# Patient Record
Sex: Male | Born: 1985 | Race: Black or African American | Hispanic: No | Marital: Single | Smoking: Never smoker
Health system: Southern US, Community
[De-identification: ages and names within clinical notes are randomized; demographics above are authoritative.]

---

## 2006-02-09 ENCOUNTER — Emergency Department: Payer: Self-pay

## 2008-10-18 ENCOUNTER — Ambulatory Visit: Payer: Self-pay | Admitting: Internal Medicine

## 2018-09-22 ENCOUNTER — Other Ambulatory Visit: Payer: Self-pay

## 2018-09-22 DIAGNOSIS — Z20822 Contact with and (suspected) exposure to covid-19: Secondary | ICD-10-CM

## 2018-09-23 LAB — NOVEL CORONAVIRUS, NAA: SARS-CoV-2, NAA: NOT DETECTED

## 2018-10-05 ENCOUNTER — Emergency Department
Admission: EM | Admit: 2018-10-05 | Discharge: 2018-10-05 | Disposition: A | Discharge status: Home / Self Care | Payer: BC Managed Care – PPO | Attending: Emergency Medicine | Admitting: Emergency Medicine

## 2018-10-05 ENCOUNTER — Encounter: Payer: Self-pay | Admitting: Emergency Medicine

## 2018-10-05 ENCOUNTER — Other Ambulatory Visit: Payer: Self-pay

## 2018-10-05 ENCOUNTER — Emergency Department: Payer: BC Managed Care – PPO

## 2018-10-05 DIAGNOSIS — Y999 Unspecified external cause status: Secondary | ICD-10-CM | POA: Diagnosis not present

## 2018-10-05 DIAGNOSIS — X500XXA Overexertion from strenuous movement or load, initial encounter: Secondary | ICD-10-CM | POA: Insufficient documentation

## 2018-10-05 DIAGNOSIS — Y9389 Activity, other specified: Secondary | ICD-10-CM | POA: Diagnosis not present

## 2018-10-05 DIAGNOSIS — S161XXA Strain of muscle, fascia and tendon at neck level, initial encounter: Secondary | ICD-10-CM | POA: Diagnosis not present

## 2018-10-05 DIAGNOSIS — Z79899 Other long term (current) drug therapy: Secondary | ICD-10-CM | POA: Insufficient documentation

## 2018-10-05 DIAGNOSIS — S299XXA Unspecified injury of thorax, initial encounter: Secondary | ICD-10-CM | POA: Diagnosis present

## 2018-10-05 DIAGNOSIS — R52 Pain, unspecified: Secondary | ICD-10-CM

## 2018-10-05 DIAGNOSIS — Y9289 Other specified places as the place of occurrence of the external cause: Secondary | ICD-10-CM | POA: Diagnosis not present

## 2018-10-05 MED ORDER — CYCLOBENZAPRINE HCL 10 MG PO TABS: 10.0000 mg | ORAL_TABLET | Freq: Three times a day (TID) | ORAL | 0 refills | Status: DC | PRN

## 2018-10-05 MED ORDER — IBUPROFEN 600 MG PO TABS: 600.0000 mg | ORAL_TABLET | Freq: Three times a day (TID) | ORAL | 0 refills | Status: DC | PRN

## 2018-10-05 NOTE — ED Notes (Signed)
Pt alert and oriented X4, cooperative, RR even and unlabored, color WNL. Pt in NAD.  

## 2018-10-05 NOTE — ED Triage Notes (Signed)
Pt reports last week felt like he pulled a muscle in his right arm/neck and sine then the pain has gotten worse. Pt reports hurts to look left or right, up or down.

## 2018-10-05 NOTE — Discharge Instructions (Addendum)
Follow discharge care instruction take medications as directed. °

## 2018-10-05 NOTE — ED Provider Notes (Signed)
[facility] Emergency Department Provider Note   ____________________________________________   First MD Initiated Contact with Patient 10/05/18 1258     (approximate)  I have reviewed the triage vital signs and the nursing notes.   HISTORY  Chief Complaint Back Pain    HPI Pau Banh is a 33 y.o. male patient complain of right lateral neck and upper back pain for 4 days.  Patient stated no specific provocative incident.  Patient rates he does repetitive lifting overhead at work.  Patient denies radicular component to his neck pain.  Patient denies loss sensation.  Patient rates his pain as a 7/10.  Patient described the pain as "achy".  Patient said no relief with over-the-counter anti-inflammatory medications.         History reviewed. No pertinent past medical history.  There are no active problems to display for this patient.   History reviewed. No pertinent surgical history.  Prior to Admission medications   Medication Sig Start Date End Date Taking? Authorizing Provider  cyclobenzaprine (FLEXERIL) 10 MG tablet Take 1 tablet (10 mg total) by mouth 3 (three) times daily as needed. 10/05/18   Sable Feil, PA-C  ibuprofen (ADVIL) 600 MG tablet Take 1 tablet (600 mg total) by mouth every 8 (eight) hours as needed. 10/05/18   Sable Feil, PA-C    Allergies Patient has no allergy information on record.  No family history on file.  Social History Social History   Tobacco Use  . Smoking status: Not on file  Substance Use Topics  . Alcohol use: Not on file  . Drug use: Not on file    Review of Systems Constitutional: No fever/chills Eyes: No visual changes. ENT: No sore throat. Cardiovascular: Denies chest pain. Respiratory: Denies shortness of breath. Gastrointestinal: No abdominal pain.  No nausea, no vomiting.  No diarrhea.  No constipation. Genitourinary: Negative for dysuria. Musculoskeletal: Right lateral neck and upper  back pain.  Skin: Negative for rash. Neurological: Negative for headaches, focal weakness or numbness.   ____________________________________________   PHYSICAL EXAM:  VITAL SIGNS: ED Triage Vitals  Enc Vitals Group     BP 10/05/18 1254 (!) 141/90     Pulse Rate 10/05/18 1254 85     Resp 10/05/18 1254 20     Temp 10/05/18 1254 98.7 F (37.1 C)     Temp Source 10/05/18 1254 Oral     SpO2 10/05/18 1254 97 %     Weight 10/05/18 1247 215 lb (97.5 kg)     Height 10/05/18 1247 5\' 11"  (1.803 m)     Head Circumference --      Peak Flow --      Pain Score 10/05/18 1247 7     Pain Loc --      Pain Edu? --      Excl. in [place]? --    Constitutional: Alert and oriented. Well appearing and in no acute distress. Neck: No stridor.  No cervical spine tenderness to palpation.  Decreased range of motion with lateral and flexion motions. Cardiovascular: Normal rate, regular rhythm. Grossly normal heart sounds.  Good peripheral circulation. Respiratory: Normal respiratory effort.  No retractions. Lungs CTAB. Gastrointestinal: Soft and nontender. No distention. No abdominal bruits. No CVA tenderness. Musculoskeletal: No lower extremity tenderness nor edema.  No joint effusions. Neurologic:  Normal speech and language. No gross focal neurologic deficits are appreciated. No gait instability. Skin:  Skin is warm, dry and intact. No rash noted. Psychiatric: Mood and  affect are normal. Speech and behavior are normal.  ____________________________________________   LABS (all labs ordered are listed, but only abnormal results are displayed)  Labs Reviewed - No data to display ____________________________________________  EKG   ____________________________________________  RADIOLOGY  ED MD interpretation:   Official radiology report(s): Dg Cervical Spine 2 Or 3 Views  Result Date: 10/05/2018 CLINICAL DATA:  Pt reports last week felt like he pulled a muscle in his right arm/neck and sine then  the pain has gotten worse. Pt reports hurts to look left or right, up or down. No previous surgery. Pt also denies any known injury EXAM: CERVICAL SPINE - 2-3 VIEW COMPARISON:  None. FINDINGS: There is no evidence of cervical spine fracture or prevertebral soft tissue swelling. Alignment is normal. No other significant bone abnormalities are identified. IMPRESSION: Negative cervical spine radiographs. Electronically Signed   By: Norva PavlovElizabeth  Brown M.D.   On: 10/05/2018 14:25    ____________________________________________   PROCEDURES  Procedure(s) performed (including Critical Care):  Procedures   ____________________________________________   INITIAL IMPRESSION / ASSESSMENT AND PLAN / ED COURSE  As part of my medical decision making, I reviewed the following data within the electronic MEDICAL RECORD NUMBER         Patient presents with nontraumatic neck and upper back pain for 1 week.  Patient states neck pain increased with lateral flexion motions.  Discussed neck x-ray findings with patient.  Discussed sequela of cervical strain with patient.  Patient given discharge care instruction advised take medication as directed.  Patient advised follow-up PCP if no improvement in 3 to 5 days.      ____________________________________________   FINAL CLINICAL IMPRESSION(S) / ED DIAGNOSES  Final diagnoses:  Cervical strain, acute, initial encounter     ED Discharge Orders         Ordered    cyclobenzaprine (FLEXERIL) 10 MG tablet  3 times daily PRN     10/05/18 1433    ibuprofen (ADVIL) 600 MG tablet  Every 8 hours PRN     10/05/18 1433           Note:  This document was prepared using Dragon voice recognition software and may include unintentional dictation errors.    Joni ReiningSmith, Consetta Cosner K, PA-C 10/05/18 1438    Emily FilbertWilliams, Jonathan E, MD 10/05/18 [phone]

## 2021-02-12 IMAGING — CR DG CERVICAL SPINE 2 OR 3 VIEWS
3 series · 3 of 3 positions shown · non-contrast
Comparison: None.

CLINICAL DATA: Pt reports last week felt like he pulled a muscle in
his right arm/neck and sine then the pain has gotten worse. Pt
reports hurts to look left or right, up or down. No previous
surgery. Pt also denies any known injury

EXAM:
CERVICAL SPINE - 2-3 VIEW

[c-spine lat]
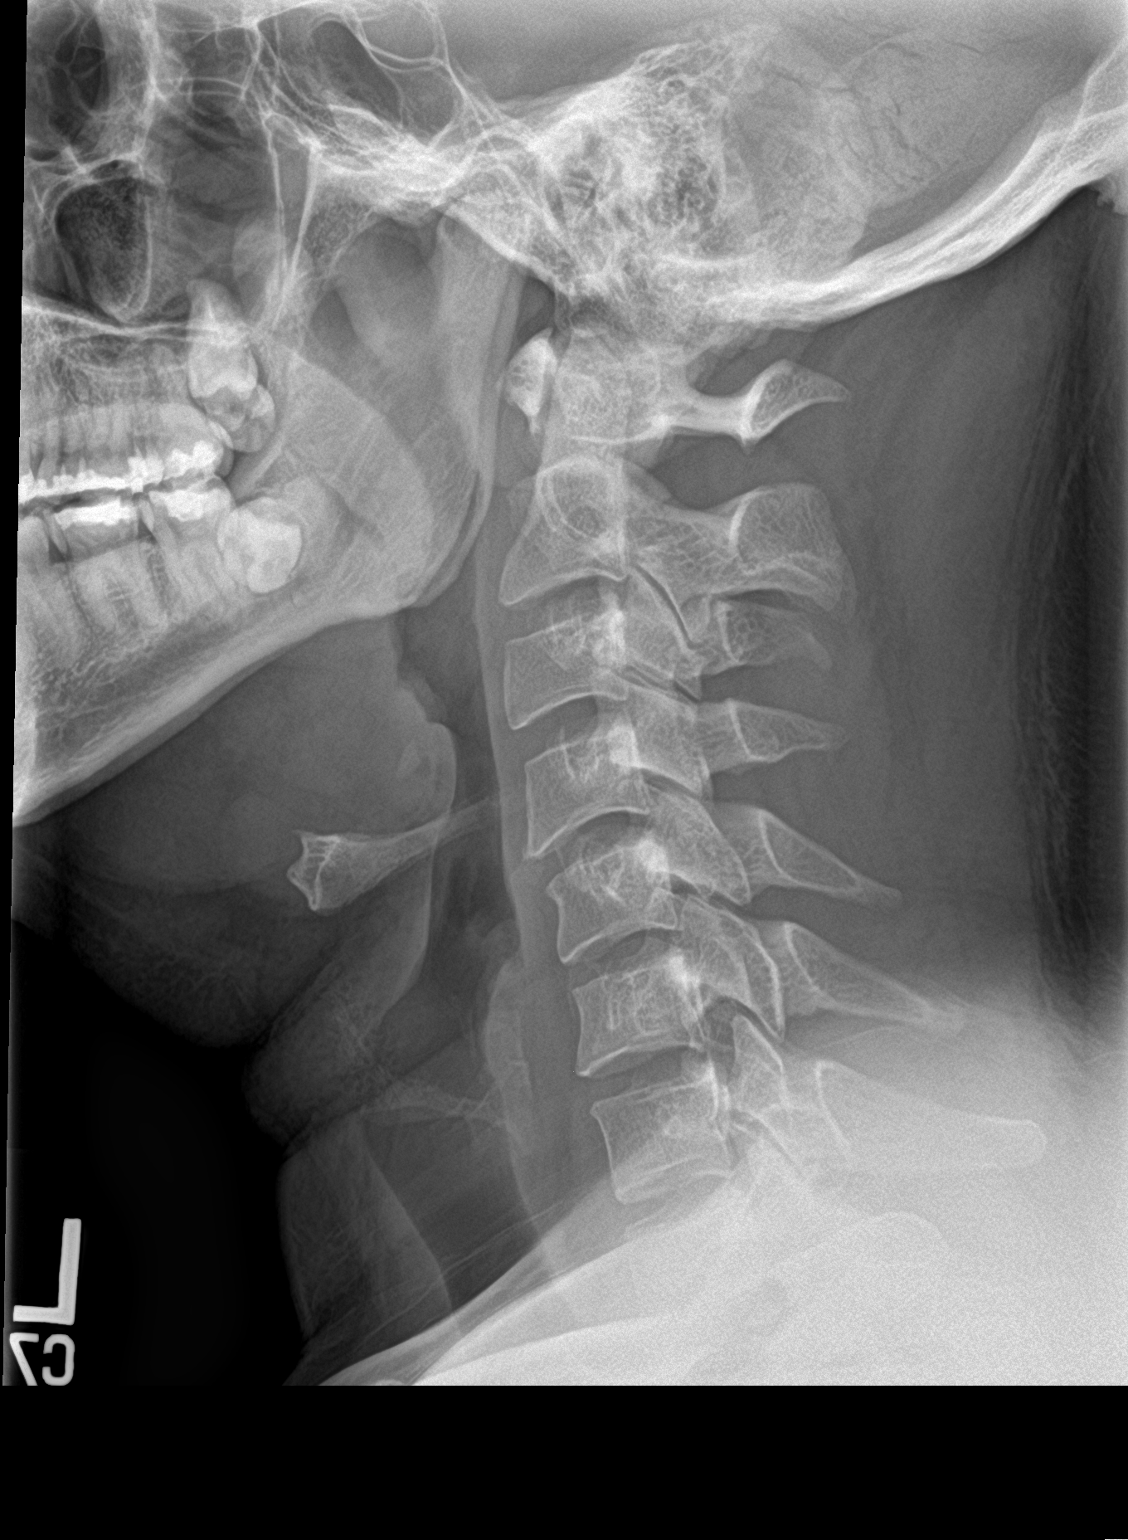

[c-spine ap]
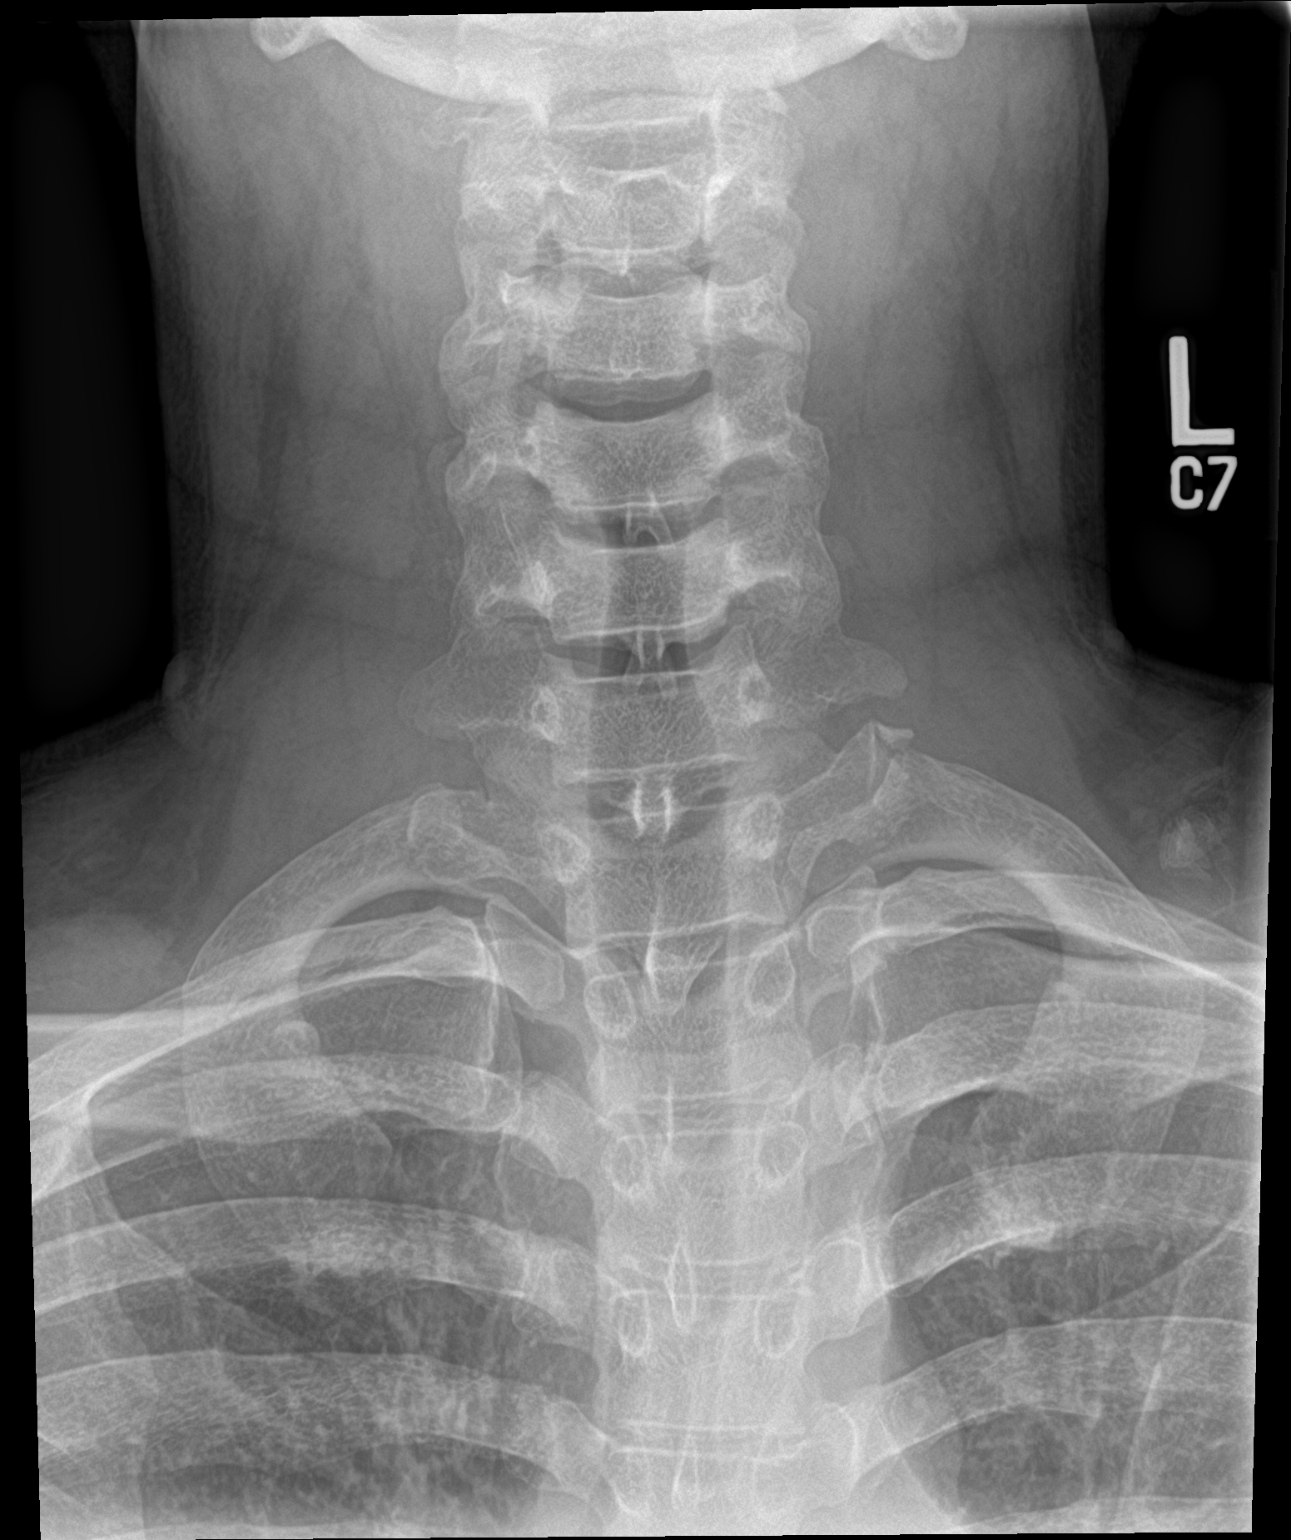

[c-spine open mouth]
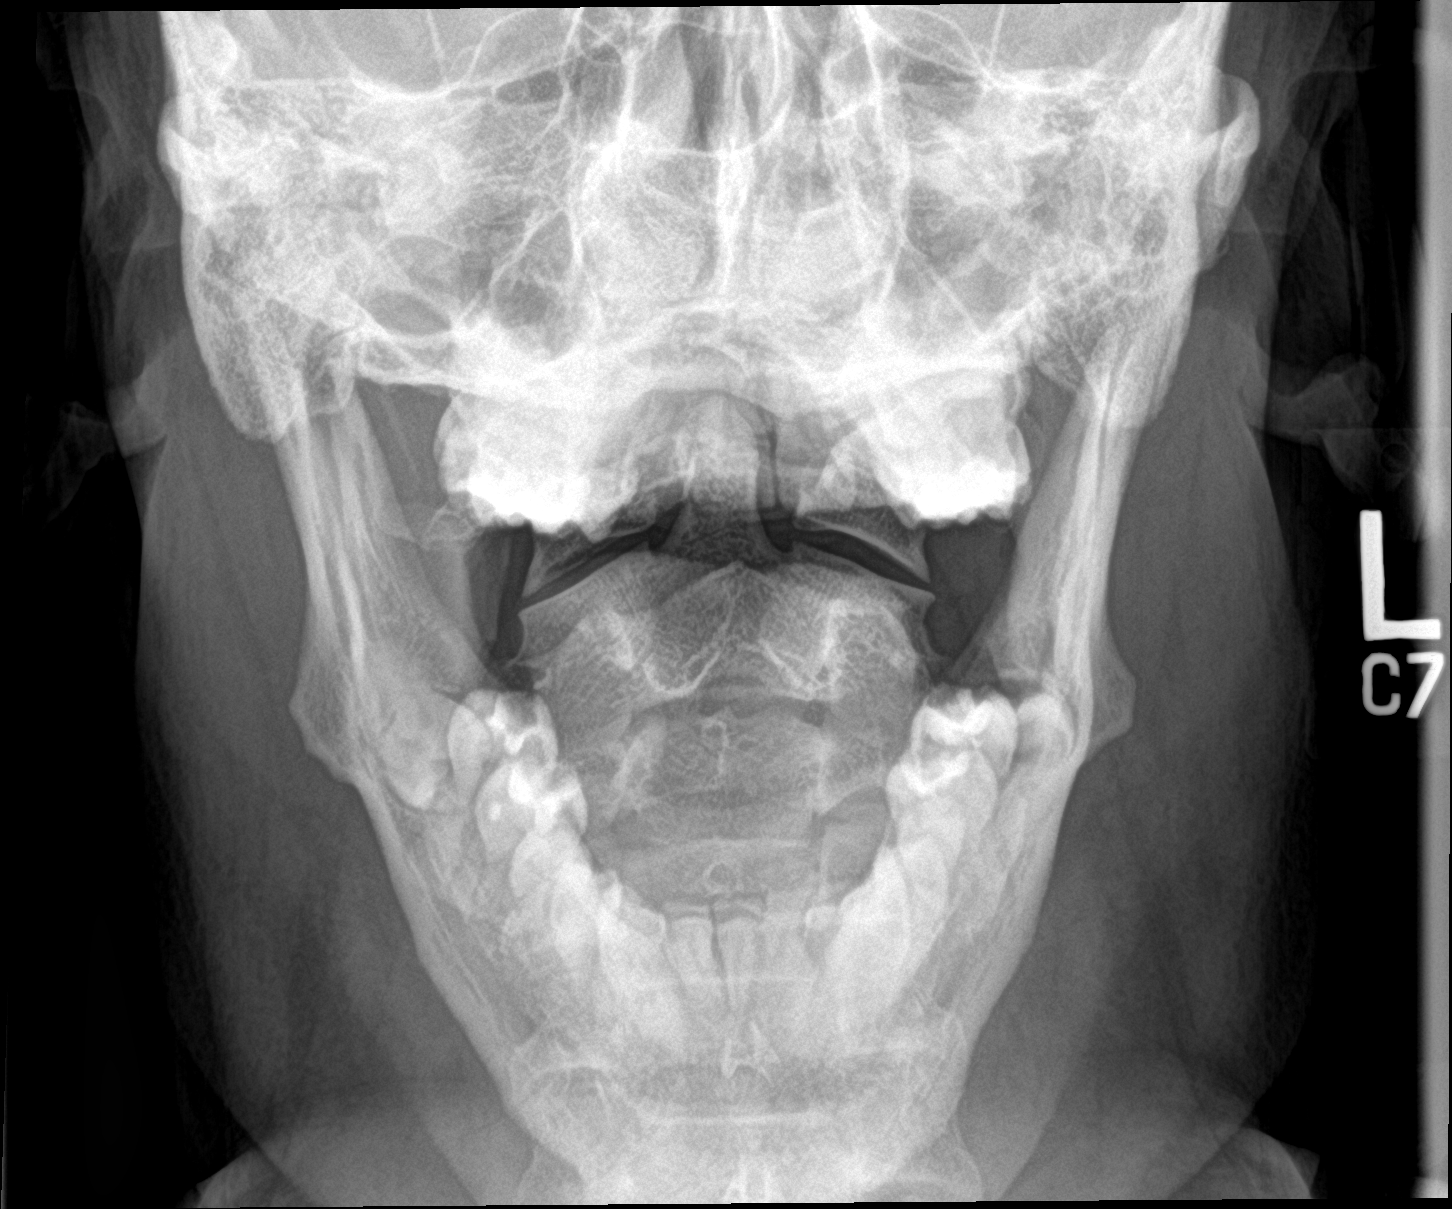

[3 of 3 positions shown; findings below may reference images not displayed]

FINDINGS: There is no evidence of cervical spine fracture or prevertebral soft
tissue swelling. Alignment is normal. No other significant bone
abnormalities are identified.
IMPRESSION: Negative cervical spine radiographs.

## 2023-06-06 ENCOUNTER — Encounter: Payer: Self-pay | Admitting: Intensive Care

## 2023-06-06 ENCOUNTER — Other Ambulatory Visit: Payer: Self-pay

## 2023-06-06 ENCOUNTER — Emergency Department
Admission: EM | Admit: 2023-06-06 | Discharge: 2023-06-06 | Disposition: A | Discharge status: Home / Self Care | Payer: PRIVATE HEALTH INSURANCE | Attending: Emergency Medicine | Admitting: Emergency Medicine

## 2023-06-06 DIAGNOSIS — X58XXXA Exposure to other specified factors, initial encounter: Secondary | ICD-10-CM | POA: Insufficient documentation

## 2023-06-06 DIAGNOSIS — S39012A Strain of muscle, fascia and tendon of lower back, initial encounter: Secondary | ICD-10-CM | POA: Diagnosis not present

## 2023-06-06 DIAGNOSIS — S3992XA Unspecified injury of lower back, initial encounter: Secondary | ICD-10-CM | POA: Diagnosis present

## 2023-06-06 MED ORDER — METHOCARBAMOL 500 MG PO TABS: 500.0000 mg | ORAL_TABLET | Freq: Three times a day (TID) | ORAL | 0 refills | Status: AC | PRN

## 2023-06-06 MED ORDER — LIDOCAINE 5 % EX PTCH
1.0000 | MEDICATED_PATCH | CUTANEOUS | Status: DC
  Administered 2023-06-06: 1 via TRANSDERMAL
  Filled 2023-06-06: qty 1

## 2023-06-06 MED ORDER — METHOCARBAMOL 500 MG PO TABS
500.0000 mg | ORAL_TABLET | Freq: Once | ORAL | Status: AC
  Administered 2023-06-06: 500 mg via ORAL
  Filled 2023-06-06: qty 1

## 2023-06-06 MED ORDER — LIDOCAINE 5 % EX PTCH: 1.0000 | MEDICATED_PATCH | Freq: Two times a day (BID) | CUTANEOUS | 1 refills | Status: AC

## 2023-06-06 MED ORDER — ACETAMINOPHEN 500 MG PO TABS
1000.0000 mg | ORAL_TABLET | Freq: Once | ORAL | Status: AC
  Administered 2023-06-06: 1000 mg via ORAL
  Filled 2023-06-06: qty 2

## 2023-06-06 MED ORDER — KETOROLAC TROMETHAMINE 30 MG/ML IJ SOLN
30.0000 mg | Freq: Once | INTRAMUSCULAR | Status: AC
  Administered 2023-06-06: 30 mg via INTRAMUSCULAR
  Filled 2023-06-06: qty 1

## 2023-06-06 NOTE — ED Provider Notes (Signed)
 [facility] Provider Note    Event Date/Time   First MD Initiated Contact with Patient 06/06/23 1127     (approximate)   History   Back Pain   HPI  Johnny West is a 38 y.o. male who presents to the ED for evaluation of Back Pain   Patient presents for evaluation of acute on chronic low back pain.  No discrete trauma, injuries.  No fevers, saddle anesthesias, urinary or stool changes.  No meds yet today.   Physical Exam   Triage Vital Signs: ED Triage Vitals  Encounter Vitals Group     BP 06/06/23 1123 (!) 139/91     Systolic BP Percentile --      Diastolic BP Percentile --      Pulse Rate 06/06/23 1123 78     Resp 06/06/23 1123 16     Temp 06/06/23 1123 98.7 F (37.1 C)     Temp Source 06/06/23 1123 Oral     SpO2 06/06/23 1123 97 %     Weight --      Height 06/06/23 1124 5\' 11"  (1.803 m)     Head Circumference --      Peak Flow --      Pain Score 06/06/23 1124 6     Pain Loc --      Pain Education --      Exclude from Growth Chart --     Most recent vital signs: Vitals:   06/06/23 1123  BP: (!) 139/91  Pulse: 78  Resp: 16  Temp: 98.7 F (37.1 C)  SpO2: 97%    General: Awake, no distress.  CV:  Good peripheral perfusion.  Resp:  Normal effort.  Abd:  No distention.  MSK:  No deformity noted.  Right-sided paraspinal lumbar tenderness is mild without overlying skin changes, rash or signs of trauma.  No midline spinal tenderness. Neuro:  No focal deficits appreciated. Cranial nerves II through XII intact 5/5 strength and sensation in all 4 extremities Other:     ED Results / Procedures / Treatments   Labs (all labs ordered are listed, but only abnormal results are displayed) Labs Reviewed - No data to display  EKG   RADIOLOGY   Official radiology report(s): No results found.  PROCEDURES and INTERVENTIONS:  Procedures  Medications  lidocaine (LIDODERM) 5 % 1 patch (1 patch Transdermal Patch Applied 06/06/23  1159)  acetaminophen (TYLENOL) tablet 1,000 mg (1,000 mg Oral Given 06/06/23 1157)  ketorolac (TORADOL) 30 MG/ML injection 30 mg (30 mg Intramuscular Given 06/06/23 1158)  methocarbamol (ROBAXIN) tablet 500 mg (500 mg Oral Given 06/06/23 1158)     IMPRESSION / MDM / ASSESSMENT AND PLAN / ED COURSE  I reviewed the triage vital signs and the nursing notes.  Differential diagnosis includes, but is not limited to, lower back strain, cauda equina, sciatica, fracture  {Patient presents with symptoms of an acute illness or injury that is potentially life-threatening.  Patient presents with acute on chronic atraumatic low back pain without red flag features.  No indications for plain film imaging to assess for fracture, no indication for MRI to assess for cauda equina.  Will provide nonnarcotic multimodal analgesia and reassess.  Suspect he will be suitable for discharge at this time.  Clinical Course as of 06/06/23 1230  Sun Jun 06, 2023  1223 Reassessed.  Feeling better.  We discussed pain medications at home, rehab exercises and appropriate ED return precautions. [DS]    Clinical Course  User Index [DS] Arline Bennett, MD     FINAL CLINICAL IMPRESSION(S) / ED DIAGNOSES   Final diagnoses:  Strain of lumbar region, initial encounter     Rx / DC Orders   ED Discharge Orders          Ordered    methocarbamol (ROBAXIN) 500 MG tablet  Every 8 hours PRN        06/06/23 1147    lidocaine (LIDODERM) 5 %  Every 12 hours        06/06/23 1147             Note:  This document was prepared using Dragon voice recognition software and may include unintentional dictation errors.   Arline Bennett, MD 06/06/23 1230

## 2023-06-06 NOTE — Discharge Instructions (Addendum)
Please take Tylenol and ibuprofen/Advil for your pain.  It is safe to take them together, or to alternate them every few hours.  Take up to 1000mg  of Tylenol at a time, up to 4 times per day.  Do not take more than 4000 mg of Tylenol in 24 hours.  For ibuprofen, take 400-600 mg, 3 - 4 times per day.  Please use lidocaine patches at your site of pain.  Apply 1 patch at a time, leave on for 12 hours, then remove for 12 hours.  12 hours on, 12 hours off.  Do not apply more than 1 patch at a time.  Use Robaxin muscle relaxer as needed for more severe/breakthrough pain, up to 3 times per day. This medication can make some people sleepy, so do not use while driving, working or Designer, television/film set

## 2023-06-06 NOTE — ED Triage Notes (Signed)
 C/o lower back pain. Reports history same. This flare up has been going on about a month and progressively getting worse. Today right side hurts worse than left  Denies recent injury

## 2023-06-06 NOTE — ED Notes (Signed)
 See triage note  Presents with lower back pain  Denies any recent injury  States this is not a new problem  Pain is mainly on the right side Ambulates well to treatment

## 2023-10-03 ENCOUNTER — Emergency Department
Admission: EM | Admit: 2023-10-03 | Discharge: 2023-10-03 | Disposition: A | Discharge status: Home / Self Care | Attending: Emergency Medicine | Admitting: Emergency Medicine

## 2023-10-03 ENCOUNTER — Other Ambulatory Visit: Payer: Self-pay

## 2023-10-03 DIAGNOSIS — R030 Elevated blood-pressure reading, without diagnosis of hypertension: Secondary | ICD-10-CM | POA: Insufficient documentation

## 2023-10-03 DIAGNOSIS — W01198A Fall on same level from slipping, tripping and stumbling with subsequent striking against other object, initial encounter: Secondary | ICD-10-CM | POA: Diagnosis not present

## 2023-10-03 DIAGNOSIS — Z23 Encounter for immunization: Secondary | ICD-10-CM | POA: Insufficient documentation

## 2023-10-03 DIAGNOSIS — S6992XA Unspecified injury of left wrist, hand and finger(s), initial encounter: Secondary | ICD-10-CM | POA: Diagnosis present

## 2023-10-03 DIAGNOSIS — S61412A Laceration without foreign body of left hand, initial encounter: Secondary | ICD-10-CM | POA: Insufficient documentation

## 2023-10-03 MED ORDER — LIDOCAINE-EPINEPHRINE-TETRACAINE (LET) TOPICAL GEL
3.0000 mL | Freq: Once | TOPICAL | Status: AC
  Administered 2023-10-03: 3 mL via TOPICAL
  Filled 2023-10-03: qty 3

## 2023-10-03 MED ORDER — TETANUS-DIPHTH-ACELL PERTUSSIS 5-2.5-18.5 LF-MCG/0.5 IM SUSY
0.5000 mL | PREFILLED_SYRINGE | Freq: Once | INTRAMUSCULAR | Status: AC
  Administered 2023-10-03: 0.5 mL via INTRAMUSCULAR
  Filled 2023-10-03: qty 0.5

## 2023-10-03 NOTE — ED Provider Notes (Signed)
 [facility] Provider Note    Event Date/Time   First MD Initiated Contact with Patient 10/03/23 1715     (approximate)   History   Laceration   HPI  Johnny West is a 38 y.o. male  with no significant past medical history presents to the emergency department with injury to the left palm that happened roughly 2-3 hours ago.  Patient states he tripped on the floor and fell into a bookcase and hit his left palm on the bookcase in the process.  Patient denies any loss of consciousness, hitting his head, any other injuries, numbness or tingling to his palm.  Patient is unsure of his last tetanus shot.    Physical Exam   Triage Vital Signs: ED Triage Vitals [10/03/23 1553]  Encounter Vitals Group     BP (!) 135/97     Girls Systolic BP Percentile      Girls Diastolic BP Percentile      Boys Systolic BP Percentile      Boys Diastolic BP Percentile      Pulse Rate (!) 106     Resp 18     Temp 98 F (36.7 C)     Temp src      SpO2 100 %     Weight 220 lb (99.8 kg)     Height 5' 10 (1.778 m)     Head Circumference      Peak Flow      Pain Score 0     Pain Loc      Pain Education      Exclude from Growth Chart     Most recent vital signs: Vitals:   10/03/23 1729 10/03/23 1848  BP:  (!) 143/90  Pulse:  85  Resp:  18  Temp:  98.6 F (37 C)  SpO2: 100% 100%    General: Awake, in no acute distress. Appears stated age. Head: Normocephalic, atraumatic. CV: Good peripheral perfusion. Radial pulses 2+ b/l. Cap refill <2 sec b/l. Respiratory:Normal respiratory effort.  No respiratory distress.  GI: Soft, non-distended. MSK: Normal ROM and 5/5 strength in b/l wrists and fingers. Skin:Warm, dry. 6 cm laceration to radial side of left palm just proximal to the thumb and left pointer finger. Neurological: A&Ox4 to person, place, time, and situation. Sensation intact and equal to b/l arms and hands. Strength symmetric. No focal deficits.   ED Results  / Procedures / Treatments   Labs (all labs ordered are listed, but only abnormal results are displayed) Labs Reviewed - No data to display   EKG     RADIOLOGY    PROCEDURES:  Critical Care performed: No   .Laceration Repair  Date/Time: 10/03/2023 6:38 PM  Performed by: Sheron Salm, PA-C Authorized by: Sheron Salm, PA-C   Consent:    Consent obtained:  Verbal   Consent given by:  Patient   Risks, benefits, and alternatives were discussed: yes     Risks discussed:  Infection, need for additional repair, nerve damage, poor cosmetic result, pain, retained foreign body, tendon damage, vascular damage and poor wound healing Universal protocol:    Procedure explained and questions answered to patient or proxy's satisfaction: yes     Immediately prior to procedure, a time out was called: yes     Patient identity confirmed:  Verbally with patient Anesthesia:    Anesthesia method:  Topical application   Topical anesthetic:  LET Laceration details:    Location:  Hand   Hand  location:  L palm   Length (cm):  6 Pre-procedure details:    Preparation:  Patient was prepped and draped in usual sterile fashion Exploration:    Hemostasis achieved with:  LET   Wound extent: no foreign body, no signs of injury, no nerve damage, no tendon damage and no vascular damage     Contaminated: no   Treatment:    Area cleansed with:  Povidone-iodine   Amount of cleaning:  Standard   Irrigation solution:  Sterile saline   Irrigation volume:  40 mL   Irrigation method:  Syringe Skin repair:    Repair method:  Sutures   Suture size:  5-0   Suture material:  Nylon   Suture technique:  Simple interrupted   Number of sutures:  6 Approximation:    Approximation:  Close Repair type:    Repair type:  Simple Post-procedure details:    Dressing:  Sterile dressing   Procedure completion:  Tolerated well, no immediate complications     MEDICATIONS ORDERED IN ED: Medications   lidocaine -EPINEPHrine -tetracaine  (LET) topical gel (3 mLs Topical Given 10/03/23 1751)  Tdap (BOOSTRIX) injection 0.5 mL (0.5 mLs Intramuscular Given 10/03/23 1735)     IMPRESSION / MDM / ASSESSMENT AND PLAN / ED COURSE  I reviewed the triage vital signs and the nursing notes.                              Differential diagnosis includes, but is not limited to, left palm laceration, nerve or tendon injury, abrasion  Patient's presentation is most consistent with acute, uncomplicated illness.  Patient is a 38 year old male presenting for left palm laceration.  Tetanus vaccine updated.  No numbness or tingling.  Normal range of motion and strength of bilateral hands and fingers.  Neurovascularly intact.  Please see procedure note for full details regarding repair with 6 simple interrupted sutures of the left palm after LET application for anesthesia.  Wound care instructions discussed with the patient.  He will need to follow-up in 7 to 10 days for suture removal in urgent care at the ER.  Ambulatory referral to primary care was placed for him to establish care and for blood pressure recheck.  The patient may return to the emergency department for any new, worsening, or concerning symptoms. Patient was given the opportunity to ask questions; all questions were answered. Emergency department return precautions were discussed with the patient.  Patient is in agreement to the treatment plan.  Patient is stable for discharge.    FINAL CLINICAL IMPRESSION(S) / ED DIAGNOSES   Final diagnoses:  Laceration of left palm without complication, initial encounter  Elevated blood pressure reading     Rx / DC Orders   ED Discharge Orders          Ordered    Ambulatory Referral to Primary Care (Establish Care)        10/03/23 1754             Note:  This document was prepared using Dragon voice recognition software and may include unintentional dictation errors.     Sheron Salm,  PA-C 10/03/23 1936    Ernest Ronal BRAVO, MD 10/06/23 [phone]

## 2023-10-03 NOTE — Discharge Instructions (Addendum)
 You have been seen in the Emergency Department (ED) today for a laceration (cut).  Please keep the cut clean but do not submerge it in the water.  It has been repaired with staples or sutures that will need to be removed in about 7-10 days. Please follow up with your doctor, an urgent care, or return to the ED for suture removal.    Please take Tylenol  (acetaminophen ) or Motrin  (ibuprofen ) as needed for discomfort as written on the box.   Please follow up with your doctor as soon as possible regarding today's emergent visit. If you do not have a doctor, I have placed a referral to establish care with primary care for you.  Please look through the list of resources and give one of the offices a call at your earliest convenience to establish care.  Return to the ED or call your doctor if you notice any signs of infection such as fever, increased pain, increased redness, pus, or other symptoms that concern you.

## 2023-10-03 NOTE — ED Triage Notes (Signed)
 Pt comes with laceration to left palm. Pt states he tripped and fell into bookcase. Pt has open lac with bleeding controlled. Pt denies any loc or hitting head.
